# Patient Record
Sex: Male | Born: 1950 | Race: White | Hispanic: No
Health system: Southern US, Community
[De-identification: ages and names within clinical notes are randomized; demographics above are authoritative.]

---

## 2021-11-30 ENCOUNTER — Other Ambulatory Visit (HOSPITAL_BASED_OUTPATIENT_CLINIC_OR_DEPARTMENT_OTHER): Payer: Self-pay | Admitting: Neurological Surgery

## 2021-11-30 ENCOUNTER — Ambulatory Visit (HOSPITAL_BASED_OUTPATIENT_CLINIC_OR_DEPARTMENT_OTHER)
Admission: RE | Admit: 2021-11-30 | Discharge: 2021-11-30 | Disposition: A | Discharge status: Home / Self Care | Payer: Medicare Other | Source: Ambulatory Visit | Attending: Diagnostic Radiology | Admitting: Diagnostic Radiology

## 2021-11-30 ENCOUNTER — Other Ambulatory Visit (HOSPITAL_BASED_OUTPATIENT_CLINIC_OR_DEPARTMENT_OTHER): Payer: Self-pay | Admitting: *Deleted

## 2021-11-30 DIAGNOSIS — M47812 Spondylosis without myelopathy or radiculopathy, cervical region: Secondary | ICD-10-CM | POA: Insufficient documentation

## 2021-11-30 DIAGNOSIS — Z981 Arthrodesis status: Secondary | ICD-10-CM | POA: Diagnosis not present

## 2021-11-30 DIAGNOSIS — Z87898 Personal history of other specified conditions: Secondary | ICD-10-CM

## 2021-12-08 ENCOUNTER — Encounter: Payer: Self-pay | Admitting: Physical Therapy

## 2021-12-08 ENCOUNTER — Ambulatory Visit: Payer: Medicare Other | Attending: Physical Therapy | Admitting: Physical Therapy

## 2021-12-08 DIAGNOSIS — R2681 Unsteadiness on feet: Secondary | ICD-10-CM | POA: Insufficient documentation

## 2021-12-08 DIAGNOSIS — R278 Other lack of coordination: Secondary | ICD-10-CM | POA: Insufficient documentation

## 2021-12-08 DIAGNOSIS — R2689 Other abnormalities of gait and mobility: Secondary | ICD-10-CM | POA: Insufficient documentation

## 2021-12-08 DIAGNOSIS — M6281 Muscle weakness (generalized): Secondary | ICD-10-CM | POA: Insufficient documentation

## 2021-12-08 DIAGNOSIS — R208 Other disturbances of skin sensation: Secondary | ICD-10-CM | POA: Insufficient documentation

## 2021-12-08 NOTE — Addendum Note (Signed)
Addended by: Janene Harvey D on: 12/08/2021 04:22 PM ? ? Modules accepted: Orders ? ?

## 2021-12-08 NOTE — Patient Instructions (Signed)
Access Code: [email or web address] ?URL: [email or web address] ?Date: 12/08/2021 ?Prepared by: [facility] ? ?Exercises ?- Marching with Resistance  - 1 x daily - 5 x weekly - 2 sets - 10 reps ?- Gastroc Stretch on Wall  - 1 x daily - 5 x weekly - 2 sets - 30 sec hold ?

## 2021-12-08 NOTE — Therapy (Addendum)
[facility] [address], [place], [place], [address] Phone: [phone]   Fax:  [phone]  Physical Therapy Evaluation  Patient Details  Name: Earl Walker MRN: [id number] Date of Birth: 05-29-1951 Referring Provider (PT): Cathren Harsh, MD   Encounter Date: 12/08/2021   Progress Note Reporting Period 12/08/21 to 12/08/21  See note below for Objective Data and Assessment of Progress/Goals.      PT End of Session - 12/08/21 1457     Visit Number 1    Number of Visits 1    Date for PT Re-Evaluation 12/08/21    Authorization Type Medicare/AARP    PT Start Time 1407    PT Stop Time 1450    PT Time Calculation (min) 43 min    Equipment Utilized During Treatment Gait belt    Activity Tolerance Patient tolerated treatment well    Behavior During Therapy WFL for tasks assessed/performed             History reviewed. No pertinent past medical history.  History reviewed. No pertinent surgical history.  There were no vitals filed for this visit.    Subjective Assessment - 12/08/21 1412     Subjective Patient present with daughter. Patient lives in [place] and plans to return in May. Reports a year ago he started having pain in B arms and N/T in the B arms and LEs, progressing to tremors. Reports surgery was longer than expected d/t a bone spur than was not initially seen. Still having weakness and N/T in the R arm, but with new onset of L arm weakness, trouble lifting it. CIR from March 9th-20th and D/C'd to home with family. Using bone stimulator at home 4 hours/day to assist with healing. Wearing neck brace at all time. no BLT.    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    Patient Stated Goals improve arm strength    Currently in Pain? Yes    Pain Score 3     Pain Location Arm    Pain Orientation Right;Left    Pain Descriptors / Indicators Tingling;Numbness    Pain Type Acute pain;Chronic pain                [facility] PT Assessment - 12/08/21 1420       Assessment   Medical Diagnosis Unsteadiness on feet    Referring Provider (PT) Cathren Harsh, MD    Onset Date/Surgical Date 11/03/21    Hand Dominance Right    Prior Therapy CIR      Precautions   Precautions None    Required Braces or Orthoses --   hard neck brace at all times, no neck ROM, no BLT     Balance Screen   Has the patient fallen in the past 6 months No    Has the patient had a decrease in activity level because of a fear of falling?  No    Is the patient reluctant to leave their home because of a fear of falling?  No      Home Environment   Living Environment Private residence    Type of [place] to enter    Entrance [place] of  Steps 4-5    Home Layout Able to live on main level with bedroom/bathroom;Laundry or work area in [place] - 2 wheels;Cane - single point   toilet raiser     Prior Function   Level of Independence Independent    Vocation Part time employment    Vocation Requirements maintenance and painting    Leisure working on cars, yard work and Financial controller   Overall Cognitive Status Within [employer] for tasks assessed      Coordination   Gross [employer] are Fluid and Coordinated Yes   in LEs   Finger Nose Finger Test intact   alt pron/supination normal   Heel Shin Test intact      ROM / Strength   AROM / PROM / Strength AROM;Strength      AROM   AROM Assessment Site Ankle    Right/Left Ankle Right;Left    Right Ankle Dorsiflexion 14    Left Ankle Dorsiflexion 23      Strength   Strength Assessment Site Ankle;Knee;Hip    Right/Left  Hip Right;Left    Right Hip Flexion 4/5    Right Hip ABduction 4+/5    Right Hip ADduction 4+/5    Left Hip Flexion 4/5    Left Hip External Rotation 4+/5    Left Hip Internal Rotation 4+/5    Right/Left Knee Right;Left    Right Knee Flexion 4+/5    Right Knee Extension 4+/5    Left Knee Flexion 4+/5    Left Knee Extension 4+/5    Right/Left Ankle Right;Left    Right Ankle Dorsiflexion 4+/5    Right Ankle Plantar Flexion 4/5   20 reps with limited ROM and eccentric control   Left Ankle Dorsiflexion 4+/5    Left Ankle Plantar Flexion 4/5   20 reps with limited ROM and eccentric control     Ambulation/Gait   Assistive device None    Gait Pattern Step-through pattern   occasionally dragging feet and slightly increased toe out   Ambulation Surface Level;Indoor    Gait velocity WNL      Standardized Balance Assessment   Standardized Balance Assessment Five Times Sit to Stand    Five times sit to stand comments  12.93 sec      Functional Gait  Assessment   Gait assessed  Yes    Gait Level Surface Walks 20 ft in less than 5.5 sec, no assistive devices, good speed, no evidence for imbalance, normal gait pattern, deviates no more than 6 in outside of the 12 in walkway width.    Change in Gait Speed Able to smoothly change walking speed without loss of balance or gait deviation. Deviate no more than 6 in outside of the 12 in walkway width.    Gait with Horizontal Head Turns --   unable to test d/t c-brace   Gait with Vertical Head Turns --   unable to test d/t c-brace   Gait and Pivot Turn Pivot turns safely within 3 sec and stops quickly with no loss of balance.    Step Over Obstacle Is able to step over 2 stacked shoe boxes taped together (9 in total height) without changing gait speed. No evidence of imbalance.    Gait with Narrow Base of Support Is able to ambulate for 10 steps heel to toe with no staggering.    Gait with Eyes Closed Walks 20 ft, uses  assistive device, slower speed,  mild gait deviations, deviates 6-10 in outside 12 in walkway width. Ambulates 20 ft in less than 9 sec but greater than 7 sec.    Ambulating Backwards Walks 20 ft, no assistive devices, good speed, no evidence for imbalance, normal gait    Steps Alternating feet, no rail.                        Objective measurements completed on examination: See above findings.                PT Education - 12/08/21 1457     Education Details exam findings, HEP- Access Code: [email or web address]    Person(s) Educated Patient    Methods Explanation;Demonstration;Tactile cues;Verbal cues;Handout    Comprehension Verbalized understanding;Returned demonstration              PT Short Term Goals - 12/08/21 1500       PT SHORT TERM GOAL #1   Title Patient to report understanding of HEP.    Status Achieved    Target Date 12/08/21               PT Long Term Goals - 12/08/21 1501       PT LONG TERM GOAL #1   Title Patient to report understanding of HEP.    Status Achieved    Target Date 12/08/21                    Plan - 12/08/21 1458     Clinical Impression Statement Patient is a 71 y/o M presenting to OPPT with c/o L>R UE weakness and N/T s/p ACDF at C4-5 and C5-6 on 11/03/21. Patient participated CIR 11/04/21-11/15/21. Reports that R UE weakness and symptoms were evident before surgery, however L UE now weak and reports trouble lifting the arm overhead. Patient today presenting with slightly limited ankle DF AROM, B hip flexion weakness, and mild gait deviations.  Patient was educated on gentle strengthening and stretching HEP and reported understanding. At this time patient does not have limitations necessitating PT. Plan to address UE needs with OT.    Personal Factors and Comorbidities Age;Comorbidity 3+;Time since onset of injury/illness/exacerbation;Profession;Past/Current Experience    Comorbidities LUE weakness, MI, Tobacco abuse, HTN, Dyslipidemia, CAD     Examination-Activity Limitations Locomotion Level;Reach Overhead;Bathing;Carry;Dressing;Lift;Hygiene/Grooming    Examination-Participation Restrictions Yard Work;Laundry;Meal Prep;Occupation;Cleaning;Community Activity    Stability/Clinical Decision Making Evolving/Moderate complexity    Clinical Decision Making Moderate    Rehab Potential Good    PT Frequency One time visit    PT Next Visit Plan DC at this time    Consulted and Agree with Plan of Care Patient;Family member/caregiver    Family Member Consulted daughter             Patient will benefit from skilled therapeutic intervention in order to improve the following deficits and impairments:  Abnormal gait, Decreased range of motion  Visit Diagnosis: Other abnormalities of gait and mobility     Problem List There are no problems to display for this patient.  Janene Harvey, PT, DPT 12/08/21 3:23 PM   [facility] [place] [address], [place], [place], [address] Phone: [phone]   Fax:  [phone]  Name: Earl Walker MRN: [id number] Date of Birth: 05/05/1951   PHYSICAL THERAPY DISCHARGE SUMMARY  Visits from Start of Care: 1  Current functional level related to goals / functional outcomes: See above;  patient did not require PT services   Remaining deficits: none   Education / Equipment: HEP  Plan: Patient agrees to discharge.  Patient goals were not met. Patient is being discharged due to 1 time visit.    Janene Harvey, PT, DPT 08/11/22 3:07 PM  [facility] [address] [place], [place], [place] [address] Phone # [phone] Fax # [phone]

## 2021-12-15 ENCOUNTER — Encounter: Payer: Self-pay | Admitting: Occupational Therapy

## 2021-12-15 ENCOUNTER — Ambulatory Visit: Payer: Medicare Other | Admitting: Occupational Therapy

## 2021-12-15 DIAGNOSIS — R278 Other lack of coordination: Secondary | ICD-10-CM

## 2021-12-15 DIAGNOSIS — M6281 Muscle weakness (generalized): Secondary | ICD-10-CM

## 2021-12-15 DIAGNOSIS — R2681 Unsteadiness on feet: Secondary | ICD-10-CM

## 2021-12-15 DIAGNOSIS — R208 Other disturbances of skin sensation: Secondary | ICD-10-CM

## 2021-12-15 DIAGNOSIS — R2689 Other abnormalities of gait and mobility: Secondary | ICD-10-CM | POA: Diagnosis not present

## 2021-12-15 NOTE — Therapy (Signed)
[facility] ?[address], [address] ?[place], [place], [address] ?Phone: [phone]   Fax:  [phone] ? ?Occupational Therapy Evaluation ? ?Patient Details  ?Name: Earl Walker ?MRN: [id number] ?Date of Birth: 1951-03-13 ?Referring Provider (OT): Reche DixonFerris, Julie A, MD ? ? ?Encounter Date: 12/15/2021 ? ? OT End of Session - 12/16/21 0937   ? ? Number of Visits 13   ? Date for OT Re-Evaluation 01/28/22   ? Authorization Type Medicare A&B   ? Authorization - Visit Number 1   ? Authorization - Number of Visits 10   ? Progress Note Due on Visit 10   ? Activity Tolerance Patient tolerated treatment well   ? Behavior [facility] for tasks assessed/performed   ? ?  ?  ? ?  ? ? ?History reviewed. No pertinent past medical history. ? ?History reviewed. No pertinent surgical history. ? ?There were no vitals filed for this visit. ? ? Subjective Assessment - 12/15/21 1324   ? ? Subjective  This started a year ago March with pain in R wrist.  Pt reports radiating pain from wrist up through arm and to shoulder.  Numbness in fingers, down legs. Pt reports s/p surgery with persistent numbness/tingling in RUE and decreased ROM, strength and functional use of LUE.   ? Patient is accompanied by: Family member   daughter, Earl Walker  ? Currently in Pain? Yes   ? Pain Score 2    ? Pain Location Arm   ? Pain Orientation Right   ? Pain Descriptors / Indicators Burning;Tingling   ? Pain Type Acute pain;Chronic pain   ? Pain Onset More than a month ago   ? Pain Frequency Occasional   ? ?  ?  ? ?  ? ? ? ? [facility] OT Assessment - 12/15/21 1327   ? ?  ? Assessment  ? Medical Diagnosis Unsteadiness on feet   ? Referring Provider (OT) Reche DixonFerris, Julie A, MD   ? Onset Date/Surgical Date 11/03/21   ? Hand Dominance Right   ? Next MD Visit 01/13/22   ? Prior Therapy CIR   ?  ? Precautions  ? Precautions None   ? Precaution Comments no BLT, lifting restrictions > 5#   ? Required Braces or Orthoses Cervical Brace    ? Cervical Brace At all times;Hard collar   ?  ? Balance Screen  ? Has the patient fallen in the past 6 months No   ? Has the patient had a decrease in activity level because of a fear of falling?  No   ? Is the patient reluctant to leave their home because of a fear of falling?  No   ?  ? Home  Environment  ? Family/patient expects to be discharged to: Private residence   ? Living Arrangements --   currently staying with adult daughter, then plans to return home mid- May  ? Available Help at Discharge Available PRN/intermittently   ? Type of Home House   ? Home Access Stairs   4 small steps  ? Home Layout Laundry or work area in basement   ? Alternate Level Stairs - Number of Steps 7- 10 steps   ? Bathroom Shower/Tub Tub/Shower unit   currently sponge bathing  ? Bathroom Toilet Handicapped height   BSC can be placed over the toilet  ? Bathroom Accessibility Yes   ? Adaptive equipment Reacher;Sock aid;Long-handled shoe horn   ? Home Equipment Walker - 2 wheels;Bedside  commode;Hand held shower head   lift chair  ? Lives With Alone   ?  ? Prior Function  ? Level of Independence Independent   ? Vocation Part time employment   ? Vocation Requirements maintenance and painting   ? Leisure working on cars, yard work and Dealer   ?  ? ADL  ? Eating/Feeding Independent   ? Eating/Feeding Patient Percentage --   dtr was assisting with cutting food initially after surgery but is no longer.  Does stand to eat due to difficulty with angle of table due to c-collar  ? Grooming Set up   ? Grooming Patient Percentage --   requires setup to open some containers  ? Upper Body Dressing Increased time   requires increased time/alternative technique for donning shirt and jacket due to decreased LUE ROM and c-collar  ? Toilet Transfer Independent   ? Toileting - Clothing Manipulation Independent   ? Tub/Shower Transfer --   currently sponge bathing  ?  ? IADL  ? Light Housekeeping Performs light daily tasks such as dishwashing, bed  making   folds laundry, requires someone else to remove from dryer  ? Meal Prep Able to complete simple warm meal prep   ? Community Mobility Relies on family or friends for transportation   ? Medication Management Is responsible for taking medication in correct dosages at correct time   ?  ? Written Expression  ? Dominant Hand Right   ? Handwriting Increased time;90% legible   ?  ? Vision - History  ? Baseline Vision Wears glasses only for reading   ?  ? Sensation  ? Light Touch Impaired by gross assessment   sensation intact, however pt reports "weaker" sensation along medial and ulnar distribution in R hand  ?  ? Coordination  ? Gross Motor Movements are Fluid and Coordinated No   ? Fine Motor Movements are Fluid and Coordinated No   ? 9 Hole Peg Test Right;Left   ? Right 9 Hole Peg Test 35.6   ? Left 9 Hole Peg Test 27.35   ? Box and Blocks R: 46 and L: 53   ? Tremors moderate tremor in RUE   ?  ? ROM / Strength  ? AROM / PROM / Strength AROM;Strength   ?  ? AROM  ? AROM Assessment Site Shoulder;Elbow   ? Right/Left Shoulder Right;Left   ? Right Shoulder Flexion 150 Degrees   ? Left Shoulder Flexion 120 Degrees   ?  ? Strength  ? Strength Assessment Site Shoulder;Elbow   ? Right/Left Shoulder Right;Left   ? Right Shoulder Flexion 4+/5   ? Left Shoulder Flexion 2-/5   ? Right/Left Elbow Right;Left   ? Right Elbow Flexion 4+/5   ? Right Elbow Extension 4+/5   ? Left Elbow Flexion 3+/5   ? Left Elbow Extension 4-/5   ?  ? Hand Function  ? Right Hand Grip (lbs) 50   ? Left Hand Grip (lbs) 60   ? ?  ?  ? ?  ? ? ? ? ? ? ? ? ? ? ? ? ? ? ? ? ? ? ? OT Education - 12/16/21 0933   ? ? Education Details Educated on role and purpose of OT as well as potential interventions and goals for therapy based on initial evaluation findings.   ? Person(s) Educated Patient;Child(ren)   ? Methods Explanation   ? Comprehension Verbalized understanding   ? ?  ?  ? ?  ? ? ?  OT Short Term Goals - 12/16/21 0946   ? ?  ? OT SHORT TERM GOAL #1   ? Title Pt will verbalize understanding with safety considerations and AE needs to ensure safety due to decreased sensation   ? Time 3   ? Period Weeks   ? Status New   ? Target Date 01/06/22   ?  ? OT SHORT TERM GOAL #2  ? Title Pt will demonstrate improved fine motor coordination for ADLs as evidenced by decreasing 9 hole peg test score for RUE by 3 secs   ? Baseline R: 35.60 and L: 27.35   ? Time 3   ? Period Weeks   ? Status New   ?  ? OT SHORT TERM GOAL #3  ? Title Pt will be able to utilize LUE as gross assist when reaching into moderate range cabinet to demonstrate increased shoulder ROM to engage in IADLs   ? Baseline L shoulder 120*   ? Time 3   ? Period Weeks   ? Status New   ? ?  ?  ? ?  ? ? ? ? OT Long Term Goals - 12/16/21 0952   ? ?  ? OT LONG TERM GOAL #1  ? Title Pt will verbalize understanding of adapted strategies and/or AE to maximize safety and Indepedence with ADLs (specifically donning shirt and jacket, buttons)   ? Time 6   ? Period Weeks   ? Status New   ? Target Date 01/28/22   ?  ? OT LONG TERM GOAL #2  ? Title Pt will demonstrate increased ease with dressing as evidenced by decreasing PPT#4(don/ doff jacket) by 5 secs   ? Baseline time TBD   ? Time 6   ? Period Weeks   ? Status New   ?  ? OT LONG TERM GOAL #3  ? Title Pt will be independent in HEP for increased ROM and strength within cervical precautions.   ? Time 6   ? Period Weeks   ? Status New   ?  ? OT LONG TERM GOAL #4  ? Title Pt will demonstrate improved ease with fastening buttons as evidenced by decreasing 3 button/ unbutton time by 10 seconds   ? Baseline time TBD   ? Time 6   ? Period Weeks   ? Status New   ? ?  ?  ? ?  ? ? ? ? ? ? ? ? Plan - 12/16/21 0938   ? ? Clinical Impression Statement Pt is a 71 y.o. male who presents to OP OT due to recent C4-C6 ACDF with weakenss in L arm > R arm but coordination impairments greater in RUE. Pt currently is staying with daughter until mid-May as he lives alone in a one level home with  laundry in basement and is working part time in [employer], commissioning prior to onset. Pt will benefit from skilled occupational therapy services to address strength and coordination, ROM, pain managem

## 2021-12-21 ENCOUNTER — Encounter: Payer: Self-pay | Admitting: Occupational Therapy

## 2021-12-21 ENCOUNTER — Ambulatory Visit: Payer: Medicare Other | Admitting: Occupational Therapy

## 2021-12-21 DIAGNOSIS — R2689 Other abnormalities of gait and mobility: Secondary | ICD-10-CM

## 2021-12-21 DIAGNOSIS — R278 Other lack of coordination: Secondary | ICD-10-CM

## 2021-12-21 DIAGNOSIS — R208 Other disturbances of skin sensation: Secondary | ICD-10-CM

## 2021-12-21 DIAGNOSIS — R2681 Unsteadiness on feet: Secondary | ICD-10-CM

## 2021-12-21 DIAGNOSIS — M6281 Muscle weakness (generalized): Secondary | ICD-10-CM

## 2021-12-21 NOTE — Therapy (Signed)
[place] ?[facility] ?[address], [address] ?[place], [place], [address] ?Phone: [phone]   Fax:  [phone] ? ?Occupational Therapy Treatment ? ?Patient Details  ?Name: Earl Walker ?MRN: [id number] ?Date of Birth: 14-Jul-1951 ?Referring Provider (OT): Reche Dixon, MD ? ? ?Encounter Date: 12/21/2021 ? ? OT End of Session - 12/21/21 1410   ? ? Visit Number 2   ? Number of Visits 13   ? Date for OT Re-Evaluation 01/28/22   ? Authorization Type Medicare A&B   ? Authorization - Visit Number 2   ? Authorization - Number of Visits 10   ? Progress Note Due on Visit 10   ? OT Start Time 1405   ? OT Stop Time 1448   ? OT Time Calculation (min) 43 min   ? Activity Tolerance Patient tolerated treatment well   ? Behavior During Therapy [facility] for tasks assessed/performed   ? ?  ?  ? ?  ? ? ?History reviewed. No pertinent past medical history. ? ?No past surgical history on file. ? ?There were no vitals filed for this visit. ? ? Subjective Assessment - 12/21/21 1408   ? ? Subjective  "Right arm changes daily on what it feels"   ? Patient is accompanied by: Family member   daughter, Danna Hefty  ? Currently in Pain? No/denies   ? Pain Onset More than a month ago   ? ?  ?  ? ?  ? ? ?Engaged in bed mobility with supervision.  Pt able to demonstrate good body mechanics with getting in/out of supine on therapy mat. ? ?Therapeutic exercise:  Therapist educated on cervical precautions and focus on scapular retraction in sitting and supine.    Pt completed 1 set of 10 each with therapist providing tactile cues at scapula and demonstrating each movement to facilitate increased ROM and understanding of each.  Pt able to complete scapular stabilization in standing with elbow support on elevated mat.  Therapist providing cues for cervical precautions when completing scapular stabilization and when completed tasks in supine with focus on maintaining neutral head position. ? ? ? ?Access Code: [email or web address] ?URL:  [email or web address] ?Date: 12/21/2021 ?Prepared by: [facility] - Outpatient  Rehab - [facility] ? ?Exercises ?- Seated Scapular Retraction  - 1 x daily - 3 x weekly - 2 sets - 10 reps ?- Seated Scapular Retraction with External Rotation  - 1 x daily - 3 x weekly - 2 sets - 10 reps ?- Crossed Arm Scapular Retraction  - 1 x daily - 3 x weekly - 2 sets - 10 reps ?- Scapular Stabilization - Elbow Support  - 1 x daily - 3 x weekly - 2 sets - 10 reps ?- Supine Thoracic Mobilization Towel Roll Vertical with Arm Stretch  - 1 x daily - 3 x weekly - 3 sets - 10 reps ?- Open Book Chest Stretch on Towel Roll  - 1 x daily - 3 x weekly - 2 sets - 10 reps ? ? ? ? ? ? ? ? ? ? ? ? ? ? ? ? ? ? OT Short Term Goals - 12/21/21 1954   ? ?  ? OT SHORT TERM GOAL #1  ? Title Pt will verbalize understanding with safety considerations and AE needs to ensure safety due to decreased sensation   ? Time 3   ? Period Weeks   ? Status On-going   ? Target Date 01/06/22   ?  ? OT  SHORT TERM GOAL #2  ? Title Pt will demonstrate improved fine motor coordination for ADLs as evidenced by decreasing 9 hole peg test score for RUE by 3 secs   ? Baseline R: 35.60 and L: 27.35   ? Time 3   ? Period Weeks   ? Status On-going   ?  ? OT SHORT TERM GOAL #3  ? Title Pt will be able to utilize LUE as gross assist when reaching into moderate range cabinet to demonstrate increased shoulder ROM to engage in IADLs   ? Baseline L shoulder 120*   ? Time 3   ? Period Weeks   ? Status On-going   ? ?  ?  ? ?  ? ? ? ? OT Long Term Goals - 12/21/21 1954   ? ?  ? OT LONG TERM GOAL #1  ? Title Pt will verbalize understanding of adapted strategies and/or AE to maximize safety and Indepedence with ADLs (specifically donning shirt and jacket, buttons)   ? Time 6   ? Period Weeks   ? Status On-going   ? Target Date 01/28/22   ?  ? OT LONG TERM GOAL #2  ? Title Pt will demonstrate increased ease with dressing as evidenced by decreasing PPT#4(don/ doff jacket) by  5 secs   ? Baseline time TBD   ? Time 6   ? Period Weeks   ? Status On-going   ?  ? OT LONG TERM GOAL #3  ? Title Pt will be independent in HEP for increased ROM and strength within cervical precautions.   ? Time 6   ? Period Weeks   ? Status On-going   ?  ? OT LONG TERM GOAL #4  ? Title Pt will demonstrate improved ease with fastening buttons as evidenced by decreasing 3 button/ unbutton time by 10 seconds   ? Baseline time TBD   ? Time 6   ? Period Weeks   ? Status On-going   ? ?  ?  ? ?  ? ? ? ? ? ? ? ? Plan - 12/21/21 1410   ? ? Clinical Impression Statement Pt able to complete scapular exercises in sitting, supine, and in standing with WB through UE.  Pt demonstrating good recall of cervical precautions during exercises and functional mobility with getting in/out of supine on therapy mat.  Pt continues to report weakness in LUE and pain and tingling in RUE.   ? OT Occupational Profile and History Detailed Assessment- Review of Records and additional review of physical, cognitive, psychosocial history related to current functional performance   ? Occupational performance deficits (Please refer to evaluation for details): ADL's;IADL's;Work   ? Body Structure / Function / Physical Skills ADL;Balance;Body mechanics;Coordination;Decreased knowledge of precautions;Decreased knowledge of use of DME;Dexterity;Endurance;FMC;GMC;IADL;Mobility;Pain;Proprioception;ROM;Sensation;Skin integrity;Strength;UE functional use   ? Rehab Potential Good   ? Clinical Decision Making Several treatment options, min-mod task modification necessary   ? Comorbidities Affecting Occupational Performance: May have comorbidities impacting occupational performance   ? Modification or Assistance to Complete Evaluation  Min-Moderate modification of tasks or assist with assess necessary to complete eval   ? OT Frequency 2x / week   ? OT Duration 6 weeks   ? OT Treatment/Interventions Self-care/ADL training;Electrical Stimulation;Therapeutic  exercise;Neuromuscular education;Energy conservation;DME and/or AE instruction;Functional Mobility Training;Passive range of motion;Therapeutic activities;Patient/family education;Balance training   ? Plan Time jacket and buttons.  Educate on safety with decreased sensation, begin fine and gross motor coordination exercsises.   ? Consulted and  Agree with Plan of Care Patient;Family member/caregiver   ? Family Member Consulted daughter   ? ?  ?  ? ?  ? ? ?Patient will benefit from skilled therapeutic intervention in order to improve the following deficits and impairments:   ?Body Structure / Function / Physical Skills: ADL, Balance, Body mechanics, Coordination, Decreased knowledge of precautions, Decreased knowledge of use of DME, Dexterity, Endurance, FMC, GMC, IADL, Mobility, Pain, Proprioception, ROM, Sensation, Skin integrity, Strength, UE functional use ?  ?  ? ? ?Visit Diagnosis: ?Muscle weakness (generalized) ? ?Other disturbances of skin sensation ? ?Other lack of coordination ? ?Unsteadiness on feet ? ?Other abnormalities of gait and mobility ? ? ? ?Problem List ?There are no problems to display for this patient. ? ? Rosalio Loud, OT ?12/21/2021, 7:58 PM ? ?[place] ?[facility] ?[address], [address] ?[place], [place], [address] ?Phone: [phone]   Fax:  [phone] ? ?Name: Earl Walker ?MRN: [id number] ?Date of Birth: 25-Sep-1950 ? ?

## 2021-12-23 ENCOUNTER — Ambulatory Visit: Payer: Medicare Other | Admitting: Occupational Therapy

## 2021-12-23 ENCOUNTER — Encounter: Payer: Self-pay | Admitting: Occupational Therapy

## 2021-12-23 DIAGNOSIS — R208 Other disturbances of skin sensation: Secondary | ICD-10-CM

## 2021-12-23 DIAGNOSIS — R2681 Unsteadiness on feet: Secondary | ICD-10-CM

## 2021-12-23 DIAGNOSIS — R278 Other lack of coordination: Secondary | ICD-10-CM

## 2021-12-23 DIAGNOSIS — M6281 Muscle weakness (generalized): Secondary | ICD-10-CM

## 2021-12-23 DIAGNOSIS — R2689 Other abnormalities of gait and mobility: Secondary | ICD-10-CM | POA: Diagnosis not present

## 2021-12-23 NOTE — Therapy (Signed)
[place] ?[facility] ?[address], [address] ?[place], [place], [address] ?Phone: [phone]   Fax:  [phone] ? ?Occupational Therapy Treatment ? ?Patient Details  ?Name: Earl Walker ?MRN: [id number] ?Date of Birth: 1950-10-11 ?Referring Provider (OT): Reche Dixon, MD ? ? ?Encounter Date: 12/23/2021 ? ? OT End of Session - 12/23/21 0937   ? ? Visit Number 3   ? Number of Visits 13   ? Date for OT Re-Evaluation 01/28/22   ? Authorization Type Medicare A&B   ? Authorization - Visit Number 3   ? Authorization - Number of Visits 10   ? Progress Note Due on Visit 10   ? OT Start Time [phone]   ? OT Stop Time 1015   ? OT Time Calculation (min) 42 min   ? Activity Tolerance Patient tolerated treatment well   ? Behavior During Therapy [facility] for tasks assessed/performed   ? ?  ?  ? ?  ? ? ?History reviewed. No pertinent past medical history. ? ?No past surgical history on file. ? ?There were no vitals filed for this visit. ? ? Subjective Assessment - 12/23/21 0936   ? ? Subjective  "I did the exercises yesterday and felt a little more pain, maybe due to use of muscles I haven't used in awhile"   ? Patient is accompanied by: Family member   daughter, Danna Hefty  ? Currently in Pain? No/denies   ? Pain Onset More than a month ago   ? ?  ?  ? ?  ? ? ?Reviewed HEP.  Pt reports focusing on LUE for improved ROM, reporting that he notices decreased quality of movement as he fatigues.  Pt able to recall technique of each and demonstrate appropriate technique with only min cues to improve attention to quality of movement as pt with tendency to "overdo it".   ? ?Completed floor transfer as pt reports completing exercises on the floor at home as his bed was too soft.  Educated on being near surface to allow pt to push up with hands on surface to increase safety and ease with getting up from floor.  Pt able to complete without assistance. ? ?ADL: educated on alternative strategies to don jacket. Due to  increased strength and ROM in LUE, pt able to complete donning of jacket with improved ease by donning RUE first with LUE pulling sleeve over shoulders and then placing LUE into sleeve. This technique showed improved ease as well as decreased interference with cervical precautions as alternative technique pt had devised s/p surgery.  Educated on use of button hook with significant improvements noted.  Therapist also demonstrated technique for unbuttoning buttons with pt able to replicate with improvements. ? ? [facility] OT Assessment - 12/23/21 0001   ? ?  ? Observation/Other Assessments  ? Other Surveys  Select   ? Physical Performance Test   Yes   ? Donning Doffing Jacket Time (seconds) 18.19   ? Donning Doffing Jacket Comments button/unbutton 3 buttons: 56.81   ? ?  ?  ? ?  ? ? ? ? ? ? ? ? ? ? ? ? ? ? ? ? ? ? ? ? ? OT Short Term Goals - 12/21/21 1954   ? ?  ? OT SHORT TERM GOAL #1  ? Title Pt will verbalize understanding with safety considerations and AE needs to ensure safety due to decreased sensation   ? Time 3   ? Period Weeks   ?  Status On-going   ? Target Date 01/06/22   ?  ? OT SHORT TERM GOAL #2  ? Title Pt will demonstrate improved fine motor coordination for ADLs as evidenced by decreasing 9 hole peg test score for RUE by 3 secs   ? Baseline R: 35.60 and L: 27.35   ? Time 3   ? Period Weeks   ? Status On-going   ?  ? OT SHORT TERM GOAL #3  ? Title Pt will be able to utilize LUE as gross assist when reaching into moderate range cabinet to demonstrate increased shoulder ROM to engage in IADLs   ? Baseline L shoulder 120*   ? Time 3   ? Period Weeks   ? Status On-going   ? ?  ?  ? ?  ? ? ? ? OT Long Term Goals - 12/21/21 1954   ? ?  ? OT LONG TERM GOAL #1  ? Title Pt will verbalize understanding of adapted strategies and/or AE to maximize safety and Indepedence with ADLs (specifically donning shirt and jacket, buttons)   ? Time 6   ? Period Weeks   ? Status On-going   ? Target Date 01/28/22   ?  ? OT LONG TERM  GOAL #2  ? Title Pt will demonstrate increased ease with dressing as evidenced by decreasing PPT#4(don/ doff jacket) by 5 secs   ? Baseline time TBD   ? Time 6   ? Period Weeks   ? Status On-going   ?  ? OT LONG TERM GOAL #3  ? Title Pt will be independent in HEP for increased ROM and strength within cervical precautions.   ? Time 6   ? Period Weeks   ? Status On-going   ?  ? OT LONG TERM GOAL #4  ? Title Pt will demonstrate improved ease with fastening buttons as evidenced by decreasing 3 button/ unbutton time by 10 seconds   ? Baseline time TBD   ? Time 6   ? Period Weeks   ? Status On-going   ? ?  ?  ? ?  ? ? ? ? ? ? ? ? Plan - 12/23/21 0938   ? ? Clinical Impression Statement Pt demonstrating good recall of scapular exercises.  Therapist reiterated importance of having pillow under head for improved cervical alignment during exercises in supine.  Pt receptive to education on AE and adaptive techniques to increase ease and independence with donning jacket and fastening buttons.  Pt pleased with improvements with modifications.   ? OT Occupational Profile and History Detailed Assessment- Review of Records and additional review of physical, cognitive, psychosocial history related to current functional performance   ? Occupational performance deficits (Please refer to evaluation for details): ADL's;IADL's;Work   ? Body Structure / Function / Physical Skills ADL;Balance;Body mechanics;Coordination;Decreased knowledge of precautions;Decreased knowledge of use of DME;Dexterity;Endurance;FMC;GMC;IADL;Mobility;Pain;Proprioception;ROM;Sensation;Skin integrity;Strength;UE functional use   ? Rehab Potential Good   ? Clinical Decision Making Several treatment options, min-mod task modification necessary   ? Comorbidities Affecting Occupational Performance: May have comorbidities impacting occupational performance   ? Modification or Assistance to Complete Evaluation  Min-Moderate modification of tasks or assist with assess  necessary to complete eval   ? OT Frequency 2x / week   ? OT Duration 6 weeks   ? OT Treatment/Interventions Self-care/ADL training;Electrical Stimulation;Therapeutic exercise;Neuromuscular education;Energy conservation;DME and/or AE instruction;Functional Mobility Training;Passive range of motion;Therapeutic activities;Patient/family education;Balance training   ? Plan Time jacket and buttons.  Educate on safety  with decreased sensation, begin fine and gross motor coordination exercsises.   ? Consulted and Agree with Plan of Care Patient;Family member/caregiver   ? Family Member Consulted daughter   ? ?  ?  ? ?  ? ? ?Patient will benefit from skilled therapeutic intervention in order to improve the following deficits and impairments:   ?Body Structure / Function / Physical Skills: ADL, Balance, Body mechanics, Coordination, Decreased knowledge of precautions, Decreased knowledge of use of DME, Dexterity, Endurance, FMC, GMC, IADL, Mobility, Pain, Proprioception, ROM, Sensation, Skin integrity, Strength, UE functional use ?  ?  ? ? ?Visit Diagnosis: ?Muscle weakness (generalized) ? ?Other disturbances of skin sensation ? ?Other lack of coordination ? ?Unsteadiness on feet ? ? ? ?Problem List ?There are no problems to display for this patient. ? ? Rosalio Loud, OT ?12/23/2021, 1:15 PM ? ?[place] ?[facility] ?[address], [address] ?[place], [place], [address] ?Phone: [phone]   Fax:  [phone] ? ?Name: Trevelle Mcgurn ?MRN: [id number] ?Date of Birth: December 23, 1950 ? ?

## 2021-12-28 ENCOUNTER — Encounter: Payer: Self-pay | Admitting: Occupational Therapy

## 2021-12-28 ENCOUNTER — Ambulatory Visit: Payer: Medicare Other | Attending: Physical Therapy | Admitting: Occupational Therapy

## 2021-12-28 DIAGNOSIS — R278 Other lack of coordination: Secondary | ICD-10-CM | POA: Insufficient documentation

## 2021-12-28 DIAGNOSIS — R208 Other disturbances of skin sensation: Secondary | ICD-10-CM | POA: Diagnosis present

## 2021-12-28 DIAGNOSIS — M6281 Muscle weakness (generalized): Secondary | ICD-10-CM | POA: Insufficient documentation

## 2021-12-28 DIAGNOSIS — R2681 Unsteadiness on feet: Secondary | ICD-10-CM | POA: Diagnosis present

## 2021-12-28 NOTE — Therapy (Signed)
[place] ?[facility] ?[address], [address] ?[place], [place], [address] ?Phone: [phone]   Fax:  [phone] ? ?Occupational Therapy Treatment ? ?Patient Details  ?Name: Earl Walker ?MRN: [id number] ?Date of Birth: 1951/04/02 ?Referring Provider (OT): Reche Dixon, MD ? ? ?Encounter Date: 12/28/2021 ? ? OT End of Session - 12/28/21 1409   ? ? Visit Number 4   ? Number of Visits 13   ? Date for OT Re-Evaluation 01/28/22   ? Authorization Type Medicare A&B   ? Authorization - Visit Number 4   ? Authorization - Number of Visits 10   ? Progress Note Due on Visit 10   ? OT Start Time 1405   ? OT Stop Time 1447   ? OT Time Calculation (min) 42 min   ? Activity Tolerance Patient tolerated treatment well   ? Behavior During Therapy Earl Walker for tasks assessed/performed   ? ?  ?  ? ?  ? ? ?History reviewed. No pertinent past medical history. ? ?No past surgical history on file. ? ?There were no vitals filed for this visit. ? ? Subjective Assessment - 12/28/21 1407   ? ? Subjective  "The exercises are feeling easier.  My right shoulder still bothers me a bit"   ? Patient is accompanied by: Family member   daughter, Earl Walker  ? Currently in Pain? No/denies   ? Pain Onset More than a month ago   ? ?  ?  ? ?  ? ? ? ?Reviewed HEP from CIR.  Pt completed 2 sets of 15 chest press, forward raises, lateral raise, and lateral pumps.  Pt reports increased difficulty picking up items from cabinet height.  Engaged in forward punches with 1# weight 1 set of 15 and then functionl reaching to cups, plates, and then weighted plates and water filled cups in cabinet at moderate height range.  Pt demonstrating increased lateral lean and compensation with increased weight in hand with overhead reach.  Educated on ways to steadily increase weight to incorporate appropriate challenge. ? ?Grip strength: Clothespins (2, 4, 6# resistance) with placing and removing from target with focus on grip strength in R and L hand.   Pt requires visual attention to task especially when completing with R hand due to impaired sensation.  Educated on decreased sensation and use of vision to compensate.  Also educated on impaired sensation when it comes to temperature of items, especially hold coffee mug or cup in R hand. ? ? ? ? ? ? ? ? ? ? ? ? ? ? ? ? ? ? ? ? ? ? ? OT Short Term Goals - 12/21/21 1954   ? ?  ? OT SHORT TERM GOAL #1  ? Title Pt will verbalize understanding with safety considerations and AE needs to ensure safety due to decreased sensation   ? Time 3   ? Period Weeks   ? Status On-going   ? Target Date 01/06/22   ?  ? OT SHORT TERM GOAL #2  ? Title Pt will demonstrate improved fine motor coordination for ADLs as evidenced by decreasing 9 hole peg test score for RUE by 3 secs   ? Baseline R: 35.60 and L: 27.35   ? Time 3   ? Period Weeks   ? Status On-going   ?  ? OT SHORT TERM GOAL #3  ? Title Pt will be able to utilize LUE as gross assist when reaching into moderate range cabinet  to demonstrate increased shoulder ROM to engage in IADLs   ? Baseline L shoulder 120*   ? Time 3   ? Period Weeks   ? Status On-going   ? ?  ?  ? ?  ? ? ? ? OT Long Term Goals - 12/21/21 1954   ? ?  ? OT LONG TERM GOAL #1  ? Title Pt will verbalize understanding of adapted strategies and/or AE to maximize safety and Indepedence with ADLs (specifically donning shirt and jacket, buttons)   ? Time 6   ? Period Weeks   ? Status On-going   ? Target Date 01/28/22   ?  ? OT LONG TERM GOAL #2  ? Title Pt will demonstrate increased ease with dressing as evidenced by decreasing PPT#4(don/ doff jacket) by 5 secs   ? Baseline time TBD   ? Time 6   ? Period Weeks   ? Status On-going   ?  ? OT LONG TERM GOAL #3  ? Title Pt will be independent in HEP for increased ROM and strength within cervical precautions.   ? Time 6   ? Period Weeks   ? Status On-going   ?  ? OT LONG TERM GOAL #4  ? Title Pt will demonstrate improved ease with fastening buttons as evidenced by  decreasing 3 button/ unbutton time by 10 seconds   ? Baseline time TBD   ? Time 6   ? Period Weeks   ? Status On-going   ? ?  ?  ? ?  ? ? ? ? ? ? ? ? Plan - 12/28/21 1405   ? ? Clinical Impression Statement Pt reports improved ability to engage in fine and gross motor exercises as provided when in CIR.  Pt demonstrating improved control and strength in LUE, even with use of 1# dumbbells.  Pt receptive to modifications to exercises to continue to focus on functional mobility and hone in on areas of concern and challenge in the home.  Pt is demonstrating improved functional reach with LUE, however increased difficulty with weight/plate in hand.   ? OT Occupational Profile and History Detailed Assessment- Review of Records and additional review of physical, cognitive, psychosocial history related to current functional performance   ? Occupational performance deficits (Please refer to evaluation for details): ADL's;IADL's;Work   ? Body Structure / Function / Physical Skills ADL;Balance;Body mechanics;Coordination;Decreased knowledge of precautions;Decreased knowledge of use of DME;Dexterity;Endurance;FMC;GMC;IADL;Mobility;Pain;Proprioception;ROM;Sensation;Skin integrity;Strength;UE functional use   ? Rehab Potential Good   ? Clinical Decision Making Several treatment options, min-mod task modification necessary   ? Comorbidities Affecting Occupational Performance: May have comorbidities impacting occupational performance   ? Modification or Assistance to Complete Evaluation  Min-Moderate modification of tasks or assist with assess necessary to complete eval   ? OT Frequency 2x / week   ? OT Duration 6 weeks   ? OT Treatment/Interventions Self-care/ADL training;Electrical Stimulation;Therapeutic exercise;Neuromuscular education;Energy conservation;DME and/or AE instruction;Functional Mobility Training;Passive range of motion;Therapeutic activities;Patient/family education;Balance training   ? Plan Functional reach and  carrying items in L hand.  Engaged in [facility] tasks, add ball toss?   ? Consulted and Agree with Plan of Care Patient;Family member/caregiver   ? Family Member Consulted daughter   ? ?  ?  ? ?  ? ? ?Patient will benefit from skilled therapeutic intervention in order to improve the following deficits and impairments:   ?Body Structure / Function / Physical Skills: ADL, Balance, Body mechanics, Coordination, Decreased knowledge of precautions, Decreased knowledge  of use of DME, Dexterity, Endurance, FMC, GMC, IADL, Mobility, Pain, Proprioception, ROM, Sensation, Skin integrity, Strength, UE functional use ?  ?  ? ? ?Visit Diagnosis: ?Muscle weakness (generalized) ? ?Other disturbances of skin sensation ? ?Other lack of coordination ? ? ? ?Problem List ?There are no problems to display for this patient. ? ? Rosalio Loud, OT ?12/29/2021, 8:10 AM ? ?[place] ?[facility] ?[address], [address] ?[place], [place], [address] ?Phone: [phone]   Fax:  [phone] ? ?Name: Dyllen Menning ?MRN: [id number] ?Date of Birth: 1951-07-29 ? ?

## 2021-12-30 ENCOUNTER — Encounter: Payer: Self-pay | Admitting: Occupational Therapy

## 2021-12-30 ENCOUNTER — Other Ambulatory Visit (HOSPITAL_BASED_OUTPATIENT_CLINIC_OR_DEPARTMENT_OTHER): Payer: Self-pay | Admitting: Neurological Surgery

## 2021-12-30 ENCOUNTER — Ambulatory Visit: Payer: Medicare Other | Admitting: Occupational Therapy

## 2021-12-30 DIAGNOSIS — Z9889 Other specified postprocedural states: Secondary | ICD-10-CM

## 2021-12-30 DIAGNOSIS — R208 Other disturbances of skin sensation: Secondary | ICD-10-CM

## 2021-12-30 DIAGNOSIS — R278 Other lack of coordination: Secondary | ICD-10-CM

## 2021-12-30 DIAGNOSIS — M6281 Muscle weakness (generalized): Secondary | ICD-10-CM

## 2021-12-30 DIAGNOSIS — R2681 Unsteadiness on feet: Secondary | ICD-10-CM

## 2021-12-30 NOTE — Therapy (Signed)
[place] [facility] ?[address], [address] ?[place], [place], [address] ?Phone: [phone]   Fax:  [phone] ? ?Occupational Therapy Treatment ? ?Patient Details  ?Name: Earl Walker ?MRN: [id number] ?Date of Birth: 05/05/1951 ?Referring Provider (OT): Cathren Harsh, MD ? ? ?Encounter Date: 12/30/2021 ? ? OT End of Session - 12/30/21 1558   ? ? Visit Number 5   ? Number of Visits 13   ? Date for OT Re-Evaluation 01/28/22   ? Authorization Type Medicare A&B   ? Authorization - Visit Number 4   ? Authorization - Number of Visits 10   ? Progress Note Due on Visit 10   ? OT Start Time 1451   ? OT Stop Time 1534   ? OT Time Calculation (min) 43 min   ? Activity Tolerance Patient tolerated treatment well   ? Behavior During Therapy [facility] for tasks assessed/performed   ? ?  ?  ? ?  ? ? ?History reviewed. No pertinent past medical history. ? ?No past surgical history on file. ? ?There were no vitals filed for this visit. ? ? Subjective Assessment - 12/30/21 1457   ? ? Subjective  "yesterday I worked on reaching into cabinet with and without soup can"   ? Patient is accompanied by: Family member   daughter, Vickii Chafe  ? Currently in Pain? No/denies   ? Pain Onset More than a month ago   ? ?  ?  ? ?  ? ?Ther ex: Engaged in 1 set of 15 each: Towel slides for shoulder flexion, Shoulder shrugs, Seated Shoulder Horizontal Abduction, Seated Chair Push Ups, Doorway Pec Stretch at 90 Degrees Abduction.  Noted decreased L shoulder stability and ROM with LUE during horizontal abduction.  Therapist encouraged pt to complete task again in front of mirror to allow for visual feedback of movement.  Pt able to complete 5-8 before noting lag in movement.  Educated on use of gravity eliminated or minimized to focus on improved ROM and tolerance of movement and then moving against gravity for increased challenge. ? ? ?Access Code: [email or web address] ?URL: [email or web address] ?Date: 12/30/2021 ?Prepared by:  [facility] ? ?Exercises ?- Seated Shoulder Shrugs  - 1 x daily - 3 x weekly - 2 sets - 10 reps ?- Seated Scapular Retraction  - 1 x daily - 3 x weekly - 2 sets - 10 reps ?- Seated Scapular Retraction with External Rotation  - 1 x daily - 3 x weekly - 2 sets - 10 reps ?- Crossed Arm Scapular Retraction  - 1 x daily - 3 x weekly - 2 sets - 10 reps ?- Seated Shoulder Horizontal Abduction  - 1 x daily - 3 x weekly - 2 sets - 10 reps ?- Seated Chair Push Ups  - 1 x daily - 3 x weekly - 2 sets - 10 reps ?- Scapular Stabilization - Elbow Support  - 1 x daily - 3 x weekly - 2 sets - 10 reps ?- Doorway Pec Stretch at 90 Degrees Abduction  - 1 x daily - 3 x weekly - 2 sets - 10 reps ?- Supine Thoracic Mobilization Towel Roll Vertical with Arm Stretch  - 1 x daily - 3 x weekly - 3 sets - 10 reps ?- Open Book Chest Stretch on Towel Roll  - 1 x daily - 3 x weekly - 2 sets - 10 reps ? ? ? ? ? ? ? ? ? ? ? ? ? ? ? ? ? ?  OT Short Term Goals - 12/21/21 1954   ? ?  ? OT SHORT TERM GOAL #1  ? Title Pt will verbalize understanding with safety considerations and AE needs to ensure safety due to decreased sensation   ? Time 3   ? Period Weeks   ? Status On-going   ? Target Date 01/06/22   ?  ? OT SHORT TERM GOAL #2  ? Title Pt will demonstrate improved fine motor coordination for ADLs as evidenced by decreasing 9 hole peg test score for RUE by 3 secs   ? Baseline R: 35.60 and L: 27.35   ? Time 3   ? Period Weeks   ? Status On-going   ?  ? OT SHORT TERM GOAL #3  ? Title Pt will be able to utilize LUE as gross assist when reaching into moderate range cabinet to demonstrate increased shoulder ROM to engage in IADLs   ? Baseline L shoulder 120*   ? Time 3   ? Period Weeks   ? Status On-going   ? ?  ?  ? ?  ? ? ? ? OT Long Term Goals - 12/21/21 1954   ? ?  ? OT LONG TERM GOAL #1  ? Title Pt will verbalize understanding of adapted strategies and/or AE to maximize safety and Indepedence with ADLs  (specifically donning shirt and jacket, buttons)   ? Time 6   ? Period Weeks   ? Status On-going   ? Target Date 01/28/22   ?  ? OT LONG TERM GOAL #2  ? Title Pt will demonstrate increased ease with dressing as evidenced by decreasing PPT#4(don/ doff jacket) by 5 secs   ? Baseline time TBD   ? Time 6   ? Period Weeks   ? Status On-going   ?  ? OT LONG TERM GOAL #3  ? Title Pt will be independent in HEP for increased ROM and strength within cervical precautions.   ? Time 6   ? Period Weeks   ? Status On-going   ?  ? OT LONG TERM GOAL #4  ? Title Pt will demonstrate improved ease with fastening buttons as evidenced by decreasing 3 button/ unbutton time by 10 seconds   ? Baseline time TBD   ? Time 6   ? Period Weeks   ? Status On-going   ? ?  ?  ? ?  ? ? ? ? ? ? ? ? Plan - 12/30/21 1558   ? ? Clinical Impression Statement Pt demonstrating improved ROM on LUE with wall slides and doorframe exercises due to use of supported positioning and decreased compensatory movements or trunk positions.  Pt educated on use of gravity eliminated or minimized to focus on improved ROM and tolerance of movement and then moving against gravity for increased challenge.  Pt reports ongoing tightness in R shoulder area which does not really change with exercises.   ? OT Occupational Profile and History Detailed Assessment- Review of Records and additional review of physical, cognitive, psychosocial history related to current functional performance   ? Occupational performance deficits (Please refer to evaluation for details): ADL's;IADL's;Work   ? Body Structure / Function / Physical Skills ADL;Balance;Body mechanics;Coordination;Decreased knowledge of precautions;Decreased knowledge of use of DME;Dexterity;Endurance;FMC;GMC;IADL;Mobility;Pain;Proprioception;ROM;Sensation;Skin integrity;Strength;UE functional use   ? Rehab Potential Good   ? Clinical Decision Making Several treatment options, min-mod task modification necessary   ?  Comorbidities Affecting Occupational Performance: May have comorbidities impacting occupational performance   ? Modification  or Assistance to Complete Evaluation  Min-Moderate modification of tasks or assist with assess necessary to complete eval   ? OT Frequency 2x / week   ? OT Duration 6 weeks   ? OT Treatment/Interventions Self-care/ADL training;Electrical Stimulation;Therapeutic exercise;Neuromuscular education;Energy conservation;DME and/or AE instruction;Functional Mobility Training;Passive range of motion;Therapeutic activities;Patient/family education;Balance training   ? Plan Functional reach and carrying items in L hand.  Engage in [facility] tasks, add ball toss? AROM to tolerance, scapular retraction, shoulder shrugs, biceps/triceps with light weights   ? OT Home Exercise Plan [email or web address]   ? Consulted and Agree with Plan of Care Patient;Family member/caregiver   ? Family Member Consulted daughter   ? ?  ?  ? ?  ? ? ?Patient will benefit from skilled therapeutic intervention in order to improve the following deficits and impairments:   ?Body Structure / Function / Physical Skills: ADL, Balance, Body mechanics, Coordination, Decreased knowledge of precautions, Decreased knowledge of use of DME, Dexterity, Endurance, FMC, GMC, IADL, Mobility, Pain, Proprioception, ROM, Sensation, Skin integrity, Strength, UE functional use ?  ?  ? ? ?Visit Diagnosis: ?Muscle weakness (generalized) ? ?Other disturbances of skin sensation ? ?Other lack of coordination ? ?Unsteadiness on feet ? ? ? ?Problem List ?There are no problems to display for this patient. ? ? Simonne Come, [place] ?12/30/2021, 4:03 PM ? ?[place] [facility] ?[address], [address] ?[place], [place], [address] ?Phone: [phone]   Fax:  [phone] ? ?Name: Earl Walker ?MRN: [id number] ?Date of Birth: 02/29/1951 ? ?

## 2022-01-04 ENCOUNTER — Ambulatory Visit: Payer: Medicare Other | Admitting: Occupational Therapy

## 2022-01-04 ENCOUNTER — Encounter: Payer: Self-pay | Admitting: Occupational Therapy

## 2022-01-04 ENCOUNTER — Ambulatory Visit (HOSPITAL_BASED_OUTPATIENT_CLINIC_OR_DEPARTMENT_OTHER)
Admission: RE | Admit: 2022-01-04 | Discharge: 2022-01-04 | Disposition: A | Discharge status: Home / Self Care | Payer: Medicare Other | Source: Ambulatory Visit | Attending: Neurological Surgery | Admitting: Neurological Surgery

## 2022-01-04 DIAGNOSIS — M6281 Muscle weakness (generalized): Secondary | ICD-10-CM

## 2022-01-04 DIAGNOSIS — R208 Other disturbances of skin sensation: Secondary | ICD-10-CM

## 2022-01-04 DIAGNOSIS — R2681 Unsteadiness on feet: Secondary | ICD-10-CM

## 2022-01-04 DIAGNOSIS — Z9889 Other specified postprocedural states: Secondary | ICD-10-CM | POA: Diagnosis present

## 2022-01-04 DIAGNOSIS — R278 Other lack of coordination: Secondary | ICD-10-CM

## 2022-01-04 NOTE — Therapy (Signed)
[place] [facility] ?[address], [address] ?[place], [place], [address] ?Phone: [phone]   Fax:  [phone] ? ?Occupational Therapy Treatment ? ?Patient Details  ?Name: Earl Walker ?MRN: [id number] ?Date of Birth: 1951-07-27 ?Referring Provider (OT): Cathren Harsh, MD ? ? ?Encounter Date: 01/04/2022 ? ? OT End of Session - 01/04/22 1409   ? ? Visit Number 6   ? Number of Visits 13   ? Date for OT Re-Evaluation 01/28/22   ? Authorization Type Medicare A&B   ? Authorization - Visit Number 5   ? Authorization - Number of Visits 10   ? Progress Note Due on Visit 10   ? OT Start Time 1405   ? OT Stop Time 9417   ? OT Time Calculation (min) 42 min   ? Activity Tolerance Patient tolerated treatment well   ? Behavior During Therapy [facility] for tasks assessed/performed   ? ?  ?  ? ?  ? ? ?History reviewed. No pertinent past medical history. ? ?No past surgical history on file. ? ?There were no vitals filed for this visit. ? ? Subjective Assessment - 01/04/22 1408   ? ? Subjective  "Sunday I was really sore, in the neck area (pointed to upper trap)"   ? Patient is accompanied by: Family member   daughter, Vickii Chafe  ? Currently in Pain? No/denies   ? Pain Onset More than a month ago   ? ?  ?  ? ?  ? ? ?Engaged in functional reach into cabinet with focus on increased LUE shoulder flexion.  Pt able to transfer cups from moderate range to high range with increased time and effort.  Therapist increased challenge to picking up heavier plate, pt required use of BUE due to increased weight.  Discussed functional reach with use of single UE vs BUE depending on height and weight of items.  Pt demonstrating shoulder flexion 130* during assessment, but able to increase reach during functional reaching.   ? ?Reviewed HEP with focus on L shoulder strengthening with isometric movements.  Pt reports not having a chair with arms therefore removed that exercise until pt able to locate sturdy chair for chair  pushups.  ? ?Discussed transitioning back home to [place] next week with focus on progressing from supervision/setup here in  to Mod I at home. Educated on setup of commonly used items to increase functional reach and independence as well as still challenging self where appropriate to continue focus on increased strength and ROM. ? ? ? ? ? ? ? ? ? ? ? ? ? ? ? ? ? ? ? ? ? ? OT Short Term Goals - 01/04/22 1413   ? ?  ? OT SHORT TERM GOAL #1  ? Title Pt will verbalize understanding with safety considerations and AE needs to ensure safety due to decreased sensation   ? Time 3   ? Period Weeks   ? Status Achieved   ? Target Date 01/06/22   ?  ? OT SHORT TERM GOAL #2  ? Title Pt will demonstrate improved fine motor coordination for ADLs as evidenced by decreasing 9 hole peg test score for RUE by 3 secs   ? Baseline R: 35.60 and L: 27.35  -- R: 34.1 on 5/9   ? Time 3   ? Period Weeks   ? Status Not Met   ?  ? OT SHORT TERM GOAL #3  ? Title Pt will be able to utilize  LUE as gross assist when reaching into moderate range cabinet to demonstrate increased shoulder ROM to engage in IADLs   L shoulder 130*  ? Baseline L shoulder 120*   ? Time 3   ? Period Weeks   ? Status Achieved   ? ?  ?  ? ?  ? ? ? ? OT Long Term Goals - 12/21/21 1954   ? ?  ? OT LONG TERM GOAL #1  ? Title Pt will verbalize understanding of adapted strategies and/or AE to maximize safety and Indepedence with ADLs (specifically donning shirt and jacket, buttons)   ? Time 6   ? Period Weeks   ? Status On-going   ? Target Date 01/28/22   ?  ? OT LONG TERM GOAL #2  ? Title Pt will demonstrate increased ease with dressing as evidenced by decreasing PPT#4(don/ doff jacket) by 5 secs   ? Baseline time TBD   ? Time 6   ? Period Weeks   ? Status On-going   ?  ? OT LONG TERM GOAL #3  ? Title Pt will be independent in HEP for increased ROM and strength within cervical precautions.   ? Time 6   ? Period Weeks   ? Status On-going   ?  ? OT LONG TERM GOAL #4  ? Title Pt will  demonstrate improved ease with fastening buttons as evidenced by decreasing 3 button/ unbutton time by 10 seconds   ? Baseline time TBD   ? Time 6   ? Period Weeks   ? Status On-going   ? ?  ?  ? ?  ? ? ? ? ? ? ? ? Plan - 01/04/22 1410   ? ? Clinical Impression Statement Pt continues to demonstrate decreased RUE [place] due to impaired sensation and coordination in RUE.  Pt reports this comes and goes.  Pt is demonstrating improvements in LUE ROM and strength during functional reach and grasp tasks.  Pt is preparing to return home next week, therefore engaged in lengthy discussion about setup of home and modifications to increase safety and independence. Discussed attempting meal prep to prepare for being home alone.   ? OT Occupational Profile and History Detailed Assessment- Review of Records and additional review of physical, cognitive, psychosocial history related to current functional performance   ? Occupational performance deficits (Please refer to evaluation for details): ADL's;IADL's;Work   ? Body Structure / Function / Physical Skills ADL;Balance;Body mechanics;Coordination;Decreased knowledge of precautions;Decreased knowledge of use of DME;Dexterity;Endurance;FMC;GMC;IADL;Mobility;Pain;Proprioception;ROM;Sensation;Skin integrity;Strength;UE functional use   ? Rehab Potential Good   ? Clinical Decision Making Several treatment options, min-mod task modification necessary   ? Comorbidities Affecting Occupational Performance: May have comorbidities impacting occupational performance   ? Modification or Assistance to Complete Evaluation  Min-Moderate modification of tasks or assist with assess necessary to complete eval   ? OT Frequency 2x / week   ? OT Duration 6 weeks   ? OT Treatment/Interventions Self-care/ADL training;Electrical Stimulation;Therapeutic exercise;Neuromuscular education;Energy conservation;DME and/or AE instruction;Functional Mobility Training;Passive range of motion;Therapeutic  activities;Patient/family education;Balance training   ? Plan Functional reach and carrying items in L hand.  Engage in [facility] tasks, add ball toss? AROM to tolerance, scapular retraction, shoulder shrugs, biceps/triceps with light weights   ? OT Home Exercise Plan [email or web address]   ? Consulted and Agree with Plan of Care Patient;Family member/caregiver   ? Family Member Consulted daughter   ? ?  ?  ? ?  ? ? ?Patient will benefit from  skilled therapeutic intervention in order to improve the following deficits and impairments:   ?Body Structure / Function / Physical Skills: ADL, Balance, Body mechanics, Coordination, Decreased knowledge of precautions, Decreased knowledge of use of DME, Dexterity, Endurance, FMC, GMC, IADL, Mobility, Pain, Proprioception, ROM, Sensation, Skin integrity, Strength, UE functional use ?  ?  ? ? ?Visit Diagnosis: ?Muscle weakness (generalized) ? ?Other disturbances of skin sensation ? ?Other lack of coordination ? ?Unsteadiness on feet ? ? ? ?Problem List ?There are no problems to display for this patient. ? ? Simonne Come, [place] ?01/05/2022, 8:14 AM ? ?[place] ?[facility] ?[address], [address] ?[place], [place], [address] ?Phone: [phone]   Fax:  [phone] ? ?Name: Earl Walker ?MRN: [id number] ?Date of Birth: May 14, 1951 ? ?

## 2022-01-06 ENCOUNTER — Encounter: Payer: Self-pay | Admitting: Occupational Therapy

## 2022-01-06 ENCOUNTER — Ambulatory Visit: Payer: Medicare Other | Admitting: Occupational Therapy

## 2022-01-06 DIAGNOSIS — R2681 Unsteadiness on feet: Secondary | ICD-10-CM

## 2022-01-06 DIAGNOSIS — M6281 Muscle weakness (generalized): Secondary | ICD-10-CM

## 2022-01-06 DIAGNOSIS — R278 Other lack of coordination: Secondary | ICD-10-CM

## 2022-01-06 DIAGNOSIS — R208 Other disturbances of skin sensation: Secondary | ICD-10-CM

## 2022-01-06 NOTE — Therapy (Signed)
[place] [facility] ?[address], [address] ?[place], [place], [address] ?Phone: [phone]   Fax:  [phone] ? ?Occupational Therapy Treatment ? ?Patient Details  ?Name: Earl Walker ?MRN: [id number] ?Date of Birth: 1950/11/20 ?Referring Provider (OT): Cathren Harsh, MD ? ? ?Encounter Date: 01/06/2022 ? ? OT End of Session - 01/06/22 1407   ? ? Visit Number 7   ? Number of Visits 13   ? Date for OT Re-Evaluation 01/28/22   ? Authorization Type Medicare A&B   ? Authorization - Visit Number 6   ? Authorization - Number of Visits 10   ? Progress Note Due on Visit 10   ? OT Start Time 1402   ? OT Stop Time 6433   ? OT Time Calculation (min) 44 min   ? Activity Tolerance Patient tolerated treatment well   ? Behavior During Therapy [facility] for tasks assessed/performed   ? ?  ?  ? ?  ? ? ?History reviewed. No pertinent past medical history. ? ?No past surgical history on file. ? ?There were no vitals filed for this visit. ? ? Subjective Assessment - 01/06/22 1403   ? ? Subjective  "The family came up for the weekend"   ? Patient is accompanied by: Family member   daughter, Vickii Chafe  ? Currently in Pain? No/denies   ? Pain Score 0-No pain   ? Pain Onset More than a month ago   ? ?  ?  ? ?  ? ? ?ADL: Pt reports engaging in cooking last night.  Pt reports able to cut up vegetables and stir and flip items in pan without issue.  Pt did report pain in R shoulder and tightness in R hand with repetitive cutting to cut out shapes for grandchild's birthday party.   ? ?Therapeutic activity: PVC Pipe tree puzzle in standing with focus on gross and fine motor coordination and completion of task at chest to overhead range to challenge prolonged use of BUE in higher range.  Pt demonstrating use of BUE together to compensate for decreased LUE strength and ROM as well as RUE decreased sensation.  Therapist challenged pt to increase reach to challenge overhead reaching.  Discussed compensatory strategies to  complete task in lower range and then place in higher range to reduce repetitive overhead reaching.   ? ?Engaged in ball toss with chest pass and bounce pass with focus on symmetrical ROM with intermittent resistance and recoil with toss and catch.  Pt able to maintain good symmetry with ball toss with cues to attend to LUE as it fatigues.   ? ? ? ? ? ? ? ? ? ? ? ? ? ? ? ? ? ? ? ? ? ? ? OT Short Term Goals - 01/04/22 1413   ? ?  ? OT SHORT TERM GOAL #1  ? Title Pt will verbalize understanding with safety considerations and AE needs to ensure safety due to decreased sensation   ? Time 3   ? Period Weeks   ? Status Achieved   ? Target Date 01/06/22   ?  ? OT SHORT TERM GOAL #2  ? Title Pt will demonstrate improved fine motor coordination for ADLs as evidenced by decreasing 9 hole peg test score for RUE by 3 secs   ? Baseline R: 35.60 and L: 27.35  -- R: 34.1 on 5/9   ? Time 3   ? Period Weeks   ? Status Not Met   ?  ?  OT SHORT TERM GOAL #3  ? Title Pt will be able to utilize LUE as gross assist when reaching into moderate range cabinet to demonstrate increased shoulder ROM to engage in IADLs   L shoulder 130*  ? Baseline L shoulder 120*   ? Time 3   ? Period Weeks   ? Status Achieved   ? ?  ?  ? ?  ? ? ? ? OT Long Term Goals - 12/21/21 1954   ? ?  ? OT LONG TERM GOAL #1  ? Title Pt will verbalize understanding of adapted strategies and/or AE to maximize safety and Indepedence with ADLs (specifically donning shirt and jacket, buttons)   ? Time 6   ? Period Weeks   ? Status On-going   ? Target Date 01/28/22   ?  ? OT LONG TERM GOAL #2  ? Title Pt will demonstrate increased ease with dressing as evidenced by decreasing PPT#4(don/ doff jacket) by 5 secs   ? Baseline time TBD   ? Time 6   ? Period Weeks   ? Status On-going   ?  ? OT LONG TERM GOAL #3  ? Title Pt will be independent in HEP for increased ROM and strength within cervical precautions.   ? Time 6   ? Period Weeks   ? Status On-going   ?  ? OT LONG TERM GOAL #4   ? Title Pt will demonstrate improved ease with fastening buttons as evidenced by decreasing 3 button/ unbutton time by 10 seconds   ? Baseline time TBD   ? Time 6   ? Period Weeks   ? Status On-going   ? ?  ?  ? ?  ? ? ? ? ? ? ? ? Plan - 01/06/22 1408   ? ? Clinical Impression Statement Pt demonstrating improved overhead functional reach and bilateral use of UE.  Pt continues to require use of RUE to support LUE for control with heavier pots and pans and continues to report decreased sensation and [place] in R hand.  Pt continues to demonstrate good carryover of structured HEP as well as functional tasks to increase ROM and strength.   ? OT Occupational Profile and History Detailed Assessment- Review of Records and additional review of physical, cognitive, psychosocial history related to current functional performance   ? Occupational performance deficits (Please refer to evaluation for details): ADL's;IADL's;Work   ? Body Structure / Function / Physical Skills ADL;Balance;Body mechanics;Coordination;Decreased knowledge of precautions;Decreased knowledge of use of DME;Dexterity;Endurance;FMC;GMC;IADL;Mobility;Pain;Proprioception;ROM;Sensation;Skin integrity;Strength;UE functional use   ? Rehab Potential Good   ? Clinical Decision Making Several treatment options, min-mod task modification necessary   ? Comorbidities Affecting Occupational Performance: May have comorbidities impacting occupational performance   ? Modification or Assistance to Complete Evaluation  Min-Moderate modification of tasks or assist with assess necessary to complete eval   ? OT Frequency 2x / week   ? OT Duration 6 weeks   ? OT Treatment/Interventions Self-care/ADL training;Electrical Stimulation;Therapeutic exercise;Neuromuscular education;Energy conservation;DME and/or AE instruction;Functional Mobility Training;Passive range of motion;Therapeutic activities;Patient/family education;Balance training   ? Plan Functional reach and carrying items  in L hand.  Review HEP and Check off goals.   ? OT Home Exercise Plan [id number]   ? Consulted and Agree with Plan of Care Patient;Family member/caregiver   ? Family Member Consulted daughter   ? ?  ?  ? ?  ? ? ?Patient will benefit from skilled therapeutic intervention in order to improve the following deficits and  impairments:   ?Body Structure / Function / Physical Skills: ADL, Balance, Body mechanics, Coordination, Decreased knowledge of precautions, Decreased knowledge of use of DME, Dexterity, Endurance, FMC, GMC, IADL, Mobility, Pain, Proprioception, ROM, Sensation, Skin integrity, Strength, UE functional use ?  ?  ? ? ?Visit Diagnosis: ?Muscle weakness (generalized) ? ?Other disturbances of skin sensation ? ?Other lack of coordination ? ?Unsteadiness on feet ? ? ? ?Problem List ?There are no problems to display for this patient. ? ? Simonne Come, [place] ?01/06/2022, 3:49 PM ? ?[place] ?[facility] ?[address], [address] ?[place], [place], [address] ?Phone: [phone]   Fax:  [phone] ? ?Name: Bohdi Leeds ?MRN: [id number] ?Date of Birth: [birth year] ? ?

## 2022-01-10 ENCOUNTER — Ambulatory Visit: Payer: Medicare Other | Admitting: Occupational Therapy

## 2022-01-10 ENCOUNTER — Encounter: Payer: Self-pay | Admitting: Occupational Therapy

## 2022-01-10 DIAGNOSIS — M6281 Muscle weakness (generalized): Secondary | ICD-10-CM | POA: Diagnosis not present

## 2022-01-10 DIAGNOSIS — R208 Other disturbances of skin sensation: Secondary | ICD-10-CM

## 2022-01-10 DIAGNOSIS — R2681 Unsteadiness on feet: Secondary | ICD-10-CM

## 2022-01-10 DIAGNOSIS — R278 Other lack of coordination: Secondary | ICD-10-CM

## 2022-01-11 NOTE — Therapy (Signed)
[place] ?[facility] ?[address], [address] ?[place], [place], [address] ?Phone: [phone]   Fax:  [phone] ? ?Occupational Therapy Treatment ? ?Patient Details  ?Name: Earl Walker ?MRN: [id number] ?Date of Birth: [birth year] ?Referring Provider (OT): Cathren Harsh, MD ? ?OCCUPATIONAL THERAPY DISCHARGE SUMMARY ? ?Visits from Start of Care: 8 ? ?Current functional level related to goals / functional outcomes: ?Pt has met 4 of 4 LTGs with improvements in LUE ROM and strength as well as functional outcomes on PPT #4 (donning/doffing jacket) and 3 button/unbutton.  Pt has demonstrated improved mobility and techniques with dressing and IADLs. Pt continues to demonstrate decreased sensation and coordination in RUE, reporting occasional numbness/tingling throughout arm and hand.  Pt would benefit from continued OT services in outpatient setting in his home city in [place] to continue to progress functional return of LUE and RUE as well as for generalized strengthening and increased ADLs and IADLs when hard c-collar is removed. ?  ?Remaining deficits: ?Pt continues to demonstrate decreased sensation and coordination in RUE, reporting occasional numbness/tingling throughout arm and hand. ?  ?Education / Equipment: ?HEP for [facility] and strengthening, UE strengthening, and education on decreased sensation and energy conservation.  ? ?Patient agrees to discharge. Patient goals were met. Patient is being discharged due to  meeting currently established goals and returning to home in [place] where he will benefit from additional therapy services as pt will now be living alone (vs supervision here in [place]) and pt will have increased cervical ROM when c-collar is removed.. ? ? ? ? ? ?Encounter Date: 01/10/2022 ? ? OT End of Session - 01/10/22 1413   ? ? Visit Number 8   ? Number of Visits 13   ? Date for OT Re-Evaluation 01/28/22   ? Authorization Type Medicare A&B   ? Authorization - Visit Number  7   ? Authorization - Number of Visits 10   ? Progress Note Due on Visit 10   ? OT Start Time 1402   ? OT Stop Time 1448   ? OT Time Calculation (min) 46 min   ? Activity Tolerance Patient tolerated treatment well   ? Behavior During Therapy [facility] for tasks assessed/performed   ? ?  ?  ? ?  ? ? ?History reviewed. No pertinent past medical history. ? ?No past surgical history on file. ? ?There were no vitals filed for this visit. ? ? Subjective Assessment - 01/10/22 1408   ? ? Subjective  "The more I use this hand (R hand) it feels like it gets tighter and I lose more feeling"   ? Patient is accompanied by: Family member   daughter, Vickii Chafe  ? Currently in Pain? Yes   ? Pain Score --   unrated  ? Pain Location Arm   ? Pain Orientation Right   ? Pain Descriptors / Indicators Sore   ? Pain Onset More than a month ago   ? ?  ?  ? ?  ? ? ? ? ? [place] OT Assessment - 01/10/22 0001   ? ?  ? Observation/Other Assessments  ? Other Surveys  Select   ? Physical Performance Test   Yes   ? Donning Doffing Jacket Time (seconds) 11.78   18.19 on eval  ? Donning Doffing Jacket Comments button/unbutton 3 buttons: 37.60   button/unbutton 3 buttons: 56.81 on 4/27  ?  ? Sensation  ? Light Touch Impaired by gross assessment   sensation intact, however  pt reports "weaker" sensation along medial and ulnar distribution in R hand  ?  ? Coordination  ? Tremors occasional tremor in RUE, improved from eval   ?  ? AROM  ? Left Shoulder Flexion 135 Degrees   ?  ? Strength  ? Left Shoulder Flexion 3-/5   ? Left Elbow Flexion 4/5   ? Left Elbow Extension 4/5   ? ?  ?  ? ?  ? ? ? ?ADL: engaged in donning/doffing jacket with improved technique and ease.  Pt buttoned/unbuttoned 3 buttons with continued impairments due to decreased sensation and control of RUE, but much improved from evaluation.   ? ?Pt demonstrating improved functional reach in LUE and strength to engage in ADLs and IADLs as needed.   ? ? ? ? ? ? ? ? ? ? ? ? ? ? ? ? ? OT Short Term Goals  - 01/04/22 1413   ? ?  ? OT SHORT TERM GOAL #1  ? Title Pt will verbalize understanding with safety considerations and AE needs to ensure safety due to decreased sensation   ? Time 3   ? Period Weeks   ? Status Achieved   ? Target Date 01/06/22   ?  ? OT SHORT TERM GOAL #2  ? Title Pt will demonstrate improved fine motor coordination for ADLs as evidenced by decreasing 9 hole peg test score for RUE by 3 secs   ? Baseline R: 35.60 and L: 27.35  -- R: 34.1 on 5/9   ? Time 3   ? Period Weeks   ? Status Not Met   ?  ? OT SHORT TERM GOAL #3  ? Title Pt will be able to utilize LUE as gross assist when reaching into moderate range cabinet to demonstrate increased shoulder ROM to engage in IADLs   L shoulder 130*  ? Baseline L shoulder 120*   ? Time 3   ? Period Weeks   ? Status Achieved   ? ?  ?  ? ?  ? ? ? ? OT Long Term Goals - 01/10/22 1430   ? ?  ? OT LONG TERM GOAL #1  ? Title Pt will verbalize understanding of adapted strategies and/or AE to maximize safety and Indepedence with ADLs (specifically donning shirt and jacket, buttons)   ? Time 6   ? Period Weeks   ? Status Achieved   ? Target Date 01/28/22   ?  ? OT LONG TERM GOAL #2  ? Title Pt will demonstrate increased ease with dressing as evidenced by decreasing PPT#4(don/ doff jacket) by 5 secs   ? Baseline time TBD   11.78 (18.19 on 4/27)  ? Time 6   ? Period Weeks   ? Status Achieved   ?  ? OT LONG TERM GOAL #3  ? Title Pt will be independent in HEP for increased ROM and strength within cervical precautions.   ? Time 6   ? Period Weeks   ? Status Achieved   ?  ? OT LONG TERM GOAL #4  ? Title Pt will demonstrate improved ease with fastening buttons as evidenced by decreasing 3 button/ unbutton time by 10 seconds   ? Baseline time TBD   37.60 (56.81 on 4/27)  ? Time 6   ? Period Weeks   ? Status Achieved   ? ?  ?  ? ?  ? ? ? ? ? ? ? ? Plan - 01/10/22 1414   ? ?  Clinical Impression Statement Pt has made great progress with functional return of ROM and strength of  LUE impacting pt ability to engage in ADLs and IADLs with improved ease and safety.  Pt continues to demonstate decreased sensation and [place] in R hand - reporting this was the case prior to surgery.  Have engaged in discussion about continued numbness/tingling, impaired sensation and [place] and possible other route causes.  Pt will continue to benefit from OT in outpatient setting in [place] as pt is discharging at this time as he is returning home.   ? OT Occupational Profile and History Detailed Assessment- Review of Records and additional review of physical, cognitive, psychosocial history related to current functional performance   ? Occupational performance deficits (Please refer to evaluation for details): ADL's;IADL's;Work   ? Body Structure / Function / Physical Skills ADL;Balance;Body mechanics;Coordination;Decreased knowledge of precautions;Decreased knowledge of use of DME;Dexterity;Endurance;FMC;GMC;IADL;Mobility;Pain;Proprioception;ROM;Sensation;Skin integrity;Strength;UE functional use   ? Rehab Potential Good   ? Clinical Decision Making Several treatment options, min-mod task modification necessary   ? Comorbidities Affecting Occupational Performance: May have comorbidities impacting occupational performance   ? Modification or Assistance to Complete Evaluation  Min-Moderate modification of tasks or assist with assess necessary to complete eval   ? OT Frequency 2x / week   ? OT Duration 6 weeks   ? OT Treatment/Interventions Self-care/ADL training;Electrical Stimulation;Therapeutic exercise;Neuromuscular education;Energy conservation;DME and/or AE instruction;Functional Mobility Training;Passive range of motion;Therapeutic activities;Patient/family education;Balance training   ? Plan D/C   ? OT Home Exercise Plan [id number]   ? Consulted and Agree with Plan of Care Patient;Family member/caregiver   ? Family Member Consulted daughter   ? ?  ?  ? ?  ? ? ?Patient will benefit from skilled therapeutic  intervention in order to improve the following deficits and impairments:   ?Body Structure / Function / Physical Skills: ADL, Balance, Body mechanics, Coordination, Decreased knowledge of precautions, Decrease

## 2023-09-10 IMAGING — DX DG CERVICAL SPINE 2 OR 3 VIEWS
2 series · 2 of 2 positions shown · non-contrast
Comparison: X-ray 11/30/2021.

CLINICAL DATA: Postoperative followup anterior cervical fusion
C4-C6.

EXAM:
CERVICAL SPINE - 2-3 VIEW

[c-spine lat]
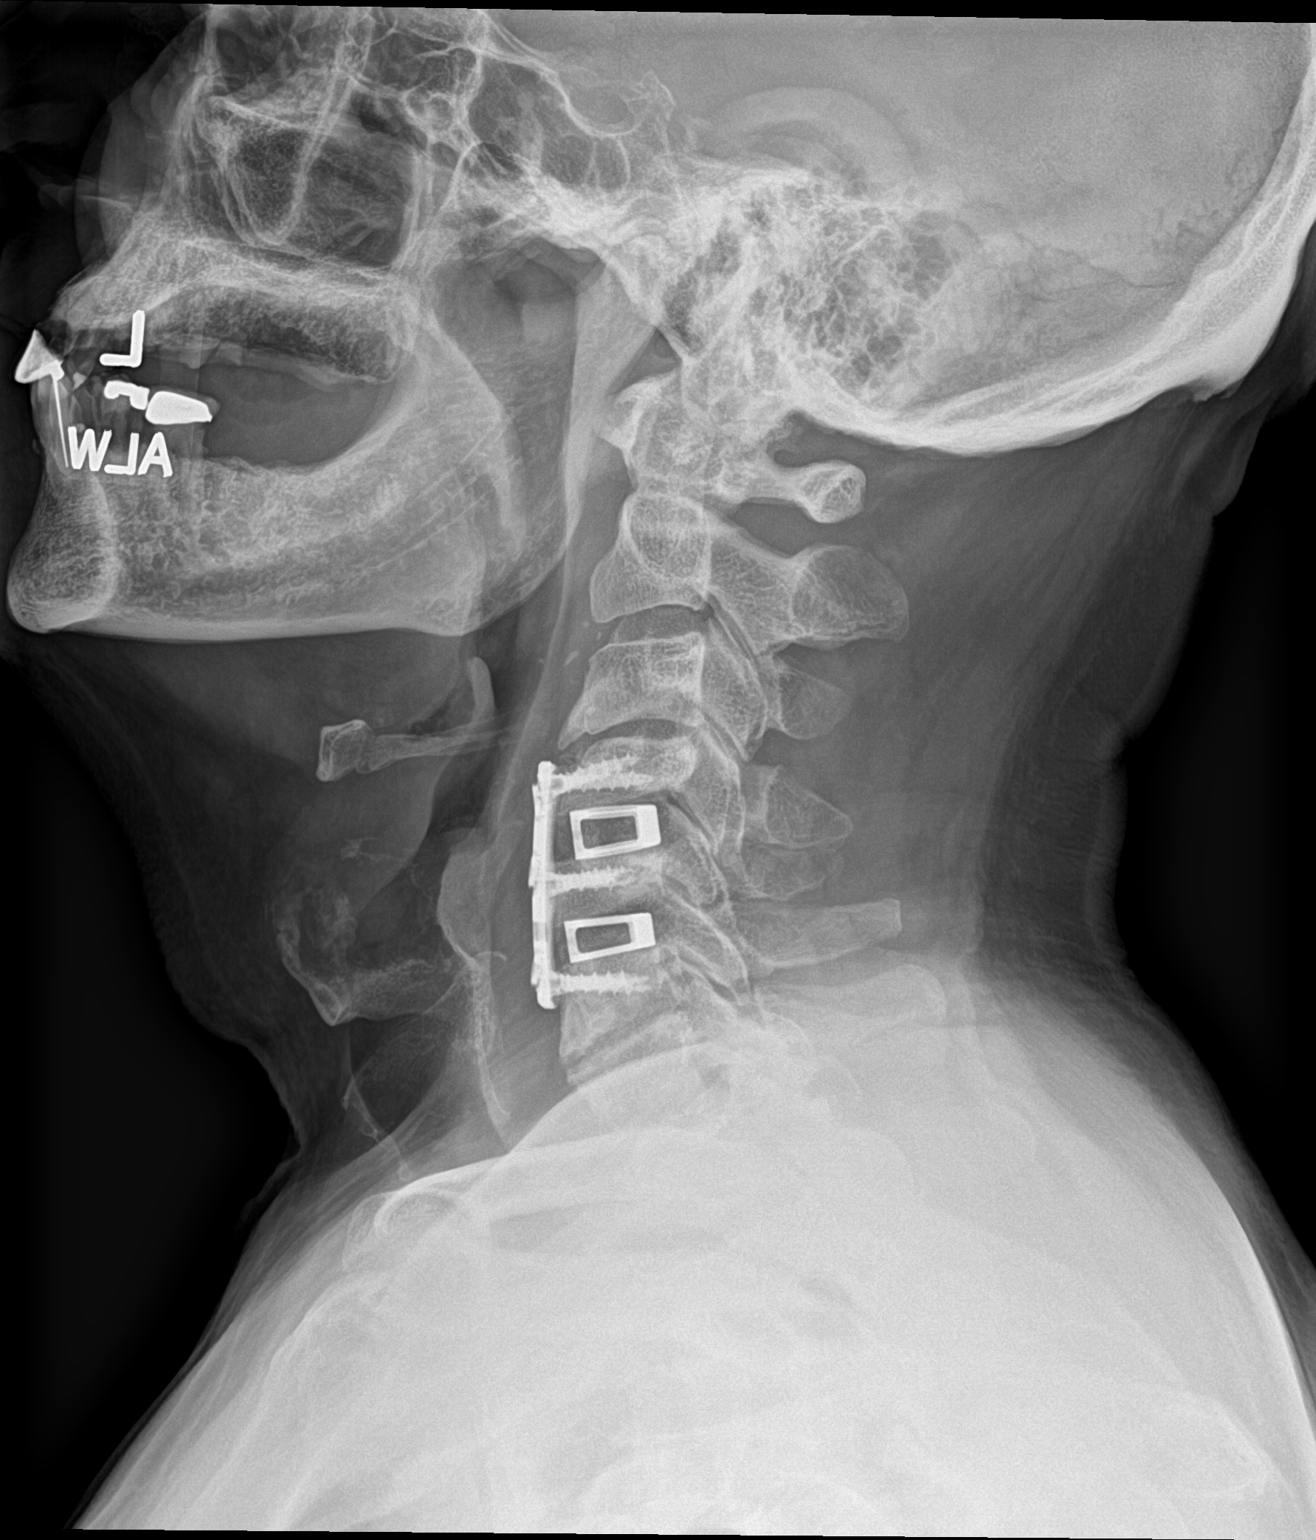

[c-spine ap]
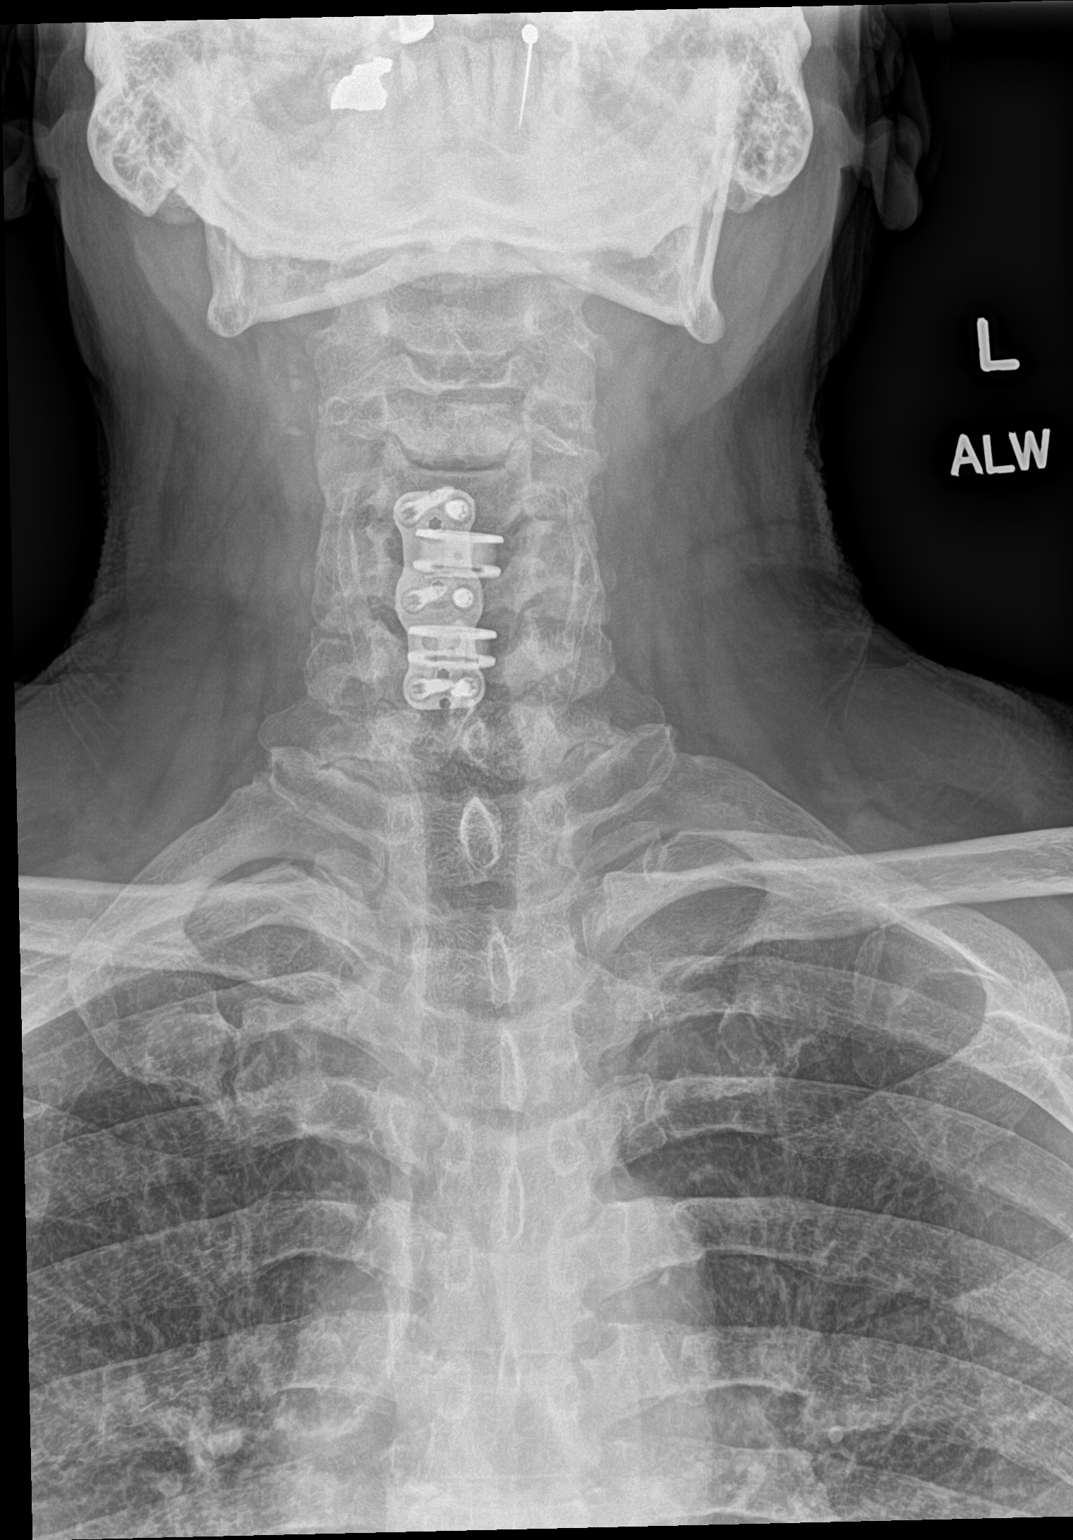

[2 of 2 positions shown; findings below may reference images not displayed]

FINDINGS: There is no acute fracture or subluxation of the cervical spine.
C4-C6 disc spacer and ACDF. Degenerative changes with disc space
narrowing and spurring similar to prior radiograph. The visualized
posterior elements appear intact. The soft tissues are unremarkable.
IMPRESSION: 1. No acute findings.
2. Degenerative changes and C4-C6 ACDF.
